# Patient Record
Sex: Female | Born: 1960 | Hispanic: No | Smoking: Never smoker
Health system: Southern US, Community
[De-identification: ages and names within clinical notes are randomized; demographics above are authoritative.]

## PROBLEM LIST (undated history)

## (undated) DIAGNOSIS — R079 Chest pain, unspecified: Secondary | ICD-10-CM

## (undated) DIAGNOSIS — M1990 Unspecified osteoarthritis, unspecified site: Secondary | ICD-10-CM

## (undated) DIAGNOSIS — Z9289 Personal history of other medical treatment: Secondary | ICD-10-CM

## (undated) DIAGNOSIS — I10 Essential (primary) hypertension: Secondary | ICD-10-CM

## (undated) HISTORY — DX: Personal history of other medical treatment: Z92.89

---

## 2010-08-04 HISTORY — PX: ANKLE SURGERY: SHX546

## 2016-01-14 ENCOUNTER — Emergency Department (HOSPITAL_COMMUNITY): Payer: Self-pay

## 2016-01-14 ENCOUNTER — Encounter (HOSPITAL_COMMUNITY): Payer: Self-pay | Admitting: *Deleted

## 2016-01-14 ENCOUNTER — Observation Stay (HOSPITAL_COMMUNITY)
Admission: EM | Admit: 2016-01-14 | Discharge: 2016-01-15 | Disposition: A | Discharge status: Home / Self Care | Payer: Self-pay | Attending: Internal Medicine | Admitting: Internal Medicine

## 2016-01-14 DIAGNOSIS — I10 Essential (primary) hypertension: Secondary | ICD-10-CM | POA: Diagnosis present

## 2016-01-14 DIAGNOSIS — I159 Secondary hypertension, unspecified: Secondary | ICD-10-CM

## 2016-01-14 DIAGNOSIS — M1712 Unilateral primary osteoarthritis, left knee: Secondary | ICD-10-CM | POA: Insufficient documentation

## 2016-01-14 DIAGNOSIS — R0789 Other chest pain: Principal | ICD-10-CM | POA: Insufficient documentation

## 2016-01-14 DIAGNOSIS — R079 Chest pain, unspecified: Secondary | ICD-10-CM | POA: Diagnosis present

## 2016-01-14 DIAGNOSIS — E669 Obesity, unspecified: Secondary | ICD-10-CM | POA: Insufficient documentation

## 2016-01-14 DIAGNOSIS — R011 Cardiac murmur, unspecified: Secondary | ICD-10-CM

## 2016-01-14 DIAGNOSIS — E785 Hyperlipidemia, unspecified: Secondary | ICD-10-CM | POA: Insufficient documentation

## 2016-01-14 DIAGNOSIS — Z8249 Family history of ischemic heart disease and other diseases of the circulatory system: Secondary | ICD-10-CM | POA: Insufficient documentation

## 2016-01-14 DIAGNOSIS — Z6841 Body Mass Index (BMI) 40.0 and over, adult: Secondary | ICD-10-CM | POA: Insufficient documentation

## 2016-01-14 HISTORY — DX: Unspecified osteoarthritis, unspecified site: M19.90

## 2016-01-14 HISTORY — DX: Essential (primary) hypertension: I10

## 2016-01-14 HISTORY — DX: Chest pain, unspecified: R07.9

## 2016-01-14 LAB — CBC
HCT: 36.4 % (ref 36.0–46.0)
Hemoglobin: 11.5 g/dL — ABNORMAL LOW (ref 12.0–15.0)
MCH: 26.6 pg (ref 26.0–34.0)
MCHC: 31.6 g/dL (ref 30.0–36.0)
MCV: 84.3 fL (ref 78.0–100.0)
PLATELETS: 249 10*3/uL (ref 150–400)
RBC: 4.32 MIL/uL (ref 3.87–5.11)
RDW: 13.9 % (ref 11.5–15.5)
WBC: 6.4 10*3/uL (ref 4.0–10.5)

## 2016-01-14 LAB — BASIC METABOLIC PANEL
Anion gap: 10 (ref 5–15)
BUN: 18 mg/dL (ref 6–20)
CO2: 27 mmol/L (ref 22–32)
CREATININE: 0.87 mg/dL (ref 0.44–1.00)
Calcium: 9.4 mg/dL (ref 8.9–10.3)
Chloride: 101 mmol/L (ref 101–111)
GFR calc Af Amer: >60 mL/min (ref >60)
Glucose, Bld: 113 mg/dL — ABNORMAL HIGH (ref 65–99)
Potassium: 3.6 mmol/L (ref 3.5–5.1)
SODIUM: 138 mmol/L (ref 135–145)

## 2016-01-14 LAB — D-DIMER, QUANTITATIVE (NOT AT ARMC): D DIMER QUANT: 0.29 ug{FEU}/mL (ref 0.00–0.50)

## 2016-01-14 LAB — I-STAT TROPONIN, ED
Troponin i, poc: 0 ng/mL (ref 0.00–0.08)
Troponin i, poc: 0 ng/mL (ref 0.00–0.08)

## 2016-01-14 MED ORDER — ASPIRIN 81 MG PO CHEW
324.0000 mg | CHEWABLE_TABLET | Freq: Once | ORAL | Status: AC
  Administered 2016-01-14: 324 mg via ORAL
  Filled 2016-01-14: qty 4

## 2016-01-14 NOTE — ED Provider Notes (Signed)
CSN: [id number]     Arrival date & time 01/14/16  1350 History   First MD Initiated Contact with Patient 01/14/16 2117     Chief Complaint  Patient presents with  . Chest Pain   Patient is a 55 y.o. female presenting with chest pain.  Chest Pain Pain location:  L chest Pain quality: pressure   Pain radiates to:  Does not radiate Pain radiates to the back: no   Pain severity:  Mild Onset quality:  Gradual Duration:  2 weeks (but worse today, started at 4am this time) Timing:  Intermittent Progression:  Partially resolved Chronicity:  Recurrent Context: breathing, movement and at rest   Relieved by:  Nothing Worsened by:  Nothing tried Ineffective treatments:  None tried Associated symptoms: headache and palpitations   Associated symptoms: no abdominal pain, no cough (chronic), no diaphoresis, no nausea, no shortness of breath and not vomiting   Risk factors: hypertension and obesity   Risk factors: no birth control, no coronary artery disease, no diabetes mellitus, no high cholesterol, no immobilization, no prior DVT/PE and no surgery     Past Medical History  Diagnosis Date  . Hypertension   . Arthritis    History reviewed. No pertinent past surgical history. No family history on file. Social History  Substance Use Topics  . Smoking status: Never Smoker   . Smokeless tobacco: None  . Alcohol Use: No   OB History    No data available     Review of Systems  Constitutional: Negative for diaphoresis.  Respiratory: Negative for cough (chronic) and shortness of breath.   Cardiovascular: Positive for chest pain and palpitations.  Gastrointestinal: Negative for nausea, vomiting and abdominal pain.  Neurological: Positive for headaches.  All other systems reviewed and are negative.     Allergies  Other  Home Medications   Prior to Admission medications   Not on File   BP 171/94 mmHg  Pulse 93  Temp(Src) 99.1 F (37.3 C) (Oral)  Resp 16  Ht  (1.575 m)   Wt 128.368 kg  BMI 51.75 kg/m2  SpO2 98%  LMP 01/09/2016 Physical Exam  Constitutional: She is oriented to person, place, and time. She appears well-developed and well-nourished. No distress.  HENT:  Head: Normocephalic and atraumatic.  Eyes: Conjunctivae are normal. Right eye exhibits no discharge. Left eye exhibits no discharge.  Neck: Normal range of motion. Neck supple.  Cardiovascular: Normal rate, regular rhythm and intact distal pulses.   Murmur (systolic) heard. Pulmonary/Chest: Effort normal and breath sounds normal. No respiratory distress.  Abdominal: Soft. Bowel sounds are normal. She exhibits no distension and no mass. There is no tenderness. There is no rebound and no guarding.  Musculoskeletal: She exhibits no edema (of LEs, no asymetey) or tenderness (no LE tenderness).  Neurological: She is alert and oriented to person, place, and time.  Skin: Skin is warm. No rash noted.  Psychiatric: She has a normal mood and affect.  Nursing note and vitals reviewed.   ED Course  Procedures (including critical care time) Labs Review Labs Reviewed  BASIC METABOLIC PANEL - Abnormal; Notable for the following:    Glucose, Bld 113 (*)    All other components within normal limits  CBC - Abnormal; Notable for the following:    Hemoglobin 11.5 (*)    All other components within normal limits  D-DIMER, QUANTITATIVE (NOT AT [facility])  I-STAT TROPOININ, ED  Rosezena Sensor, ED    Imaging Review Dg Chest 2  View  01/14/2016  CLINICAL DATA:  Left-sided chest pain since 4 a.m. EXAM: CHEST  2 VIEW COMPARISON:  None. FINDINGS: The heart size and mediastinal contours are within normal limits. Both lungs are clear. The visualized skeletal structures are unremarkable. IMPRESSION: No active cardiopulmonary disease. Electronically Signed   By: Kennith CenterEric  Mansell M.D.   On: 01/14/2016 14:41   I have personally reviewed and evaluated these images and lab results as part of my medical decision-making.    EKG Interpretation   Date/Time:  Monday January 14 2016 14:00:54 EDT Ventricular Rate:  95 PR Interval:  158 QRS Duration: 90 QT Interval:  354 QTC Calculation: 444 R Axis:   44 Text Interpretation:  Normal sinus rhythm T wave abnormality, consider  inferior ischemia Abnormal ECG No previous ECGs available Confirmed by  Bebe ShaggyWICKLINE  MD, DONALD ([id number]) on 01/14/2016 9:06:23 PM      MDM   Final diagnoses:  Chest pain, unspecified chest pain type  Heart murmur, systolic    Delta troponin negative and EKG with nonspecific T-wave changes and inferior leads. Patient has a here score of 4. Recommend stress test. Patient does not have a primary care provider established and would be unable to obtain the stress test soon.  Of note, d-dimer negative and patient overall low risk for PE, doubt. Doubt dissection, patient has no tearing back pain and pain is now resolved and denies any neurological symptoms. Doubt arrhythmia. Patient overall healthy and only has history of hypertension, unlikely to be a pleural effusion EKG not consistent with low voltage. No symptoms consistent with a pneumonia and chest x-ray negative for such. Patient does have a systolic murmur concerning for aortic stenosis but no prior echo, likely needs nonemergent evaluation for such. Doubt the chest pain is secondary to this pain is not worse with exertion. Chest pain is worse with movement and with position but no evidence of pericarditis. Patient went may benefit from short-term NSAIDs as well cardiac workup remains negative after stress test.   Discuss recommendation for stress test and patient would like to be admitted to obtain. Admit.    Sidney AceAlison Charruf Adonia Porada, MD 01/14/16 2228  Zadie Rhineonald Wickline, MD 01/15/16 [phone]

## 2016-01-14 NOTE — ED Notes (Signed)
MD at bedside. 

## 2016-01-14 NOTE — H&P (Addendum)
Carrie Rush ZOX:[id number] DOB: 1961/07/17 DOA: 01/14/2016     PCP: No primary care provider on file.   Outpatient Specialists: none Patient coming from:   home Lives   With family    Chief Complaint: Chest pressure HPI: Carrie LeiFaizeh Dubberly is a 55 y.o. female with medical history significant of HTN and arthritis    Presented with intermittent chest pain has been going on for the past 2 weeks but lately started to wake her up. At first was only lasting a few seconds but today was more severe and persistent. she woke up with another episode this morning. Describes as twisting, pinching, pressure-like sensation. Reproducible by palpation.  Not radiating not in association shortness of breath and nausea vomiting. Today she felt that if she took a deep breath it made her cough she was concerned and came to ER.  Respective includes hypertension she does not know if she has diabetes does not have primary care provider. Chest pain since he worse with movement and position  IN ER: Afebrile heart rate up to 96 blood pressure 174/115.  WBC 6.4 hemoglobin 11.5  Chest x-ray unremarkable EKG nonischemic troponin unremarkable 2    Hospitalist was called for admission for chest pain evaluation  Review of Systems:    Pertinent positives include: chest pain,   Constitutional:  No weight loss, night sweats, Fevers, chills, fatigue, weight loss  HEENT:  No headaches, Difficulty swallowing,Tooth/dental problems,Sore throat,  No sneezing, itching, ear ache, nasal congestion, post nasal drip,  Cardio-vascular:  No Orthopnea, PND, anasarca, dizziness, palpitations.no Bilateral lower extremity swelling  GI:  No heartburn, indigestion, abdominal pain, nausea, vomiting, diarrhea, change in bowel habits, loss of appetite, melena, blood in stool, hematemesis Resp:  no shortness of breath at rest. No dyspnea on exertion, No excess mucus, no productive cough, No non-productive cough, No coughing up of blood.No  change in color of mucus.No wheezing. Skin:  no rash or lesions. No jaundice GU:  no dysuria, change in color of urine, no urgency or frequency. No straining to urinate.  No flank pain.  Musculoskeletal:  No joint pain or no joint swelling. No decreased range of motion. No back pain.  Psych:  No change in mood or affect. No depression or anxiety. No memory loss.  Neuro: no localizing neurological complaints, no tingling, no weakness, no double vision, no gait abnormality, no slurred speech, no confusion  As per HPI otherwise 10 point review of systems negative.   Past Medical History: Past Medical History  Diagnosis Date  . Hypertension   . Arthritis    History reviewed. No pertinent past surgical history.   Social History:  Ambulatory  Independently     reports that she has never smoked. She does not have any smokeless tobacco history on file. She reports that she does not drink alcohol or use illicit drugs.  Allergies:   Allergies  Allergen Reactions  . Other     Air freshener causes coughing        Family History:    Family History  Problem Relation Age of Onset  . CAD Brother   . Lymphoma Other   . Diabetes Neg Hx   . Stroke Neg Hx   . Alzheimer's disease Mother     Medications: Prior to Admission medications   Medication Sig Start Date End Date Taking? Authorizing Provider  folic acid (FOLVITE) 1 MG tablet Take 10 mg by mouth daily.   Yes Historical Provider, MD  Omega-3 Fatty Acids (  FISH OIL PO) Take 1 tablet by mouth daily.   Yes Historical Provider, MD  PRESCRIPTION MEDICATION Take 1-2 tablets by mouth daily. salazopyrin   Yes Historical Provider, MD  triamterene-hydrochlorothiazide (DYAZIDE) 37.5-25 MG capsule Take 1 capsule by mouth daily.   Yes Historical Provider, MD    Physical Exam: Patient Vitals for the past 24 hrs:  BP Temp Temp src Pulse Resp SpO2 Height Weight  01/14/16 2245 118/70 mmHg - - 85 20 96 % - -  01/14/16 2230 123/67 mmHg -  - 93 17 98 % - -  01/14/16 2215 123/72 mmHg - - 91 22 99 % - -  01/14/16 2200 121/64 mmHg - - 88 18 98 % - -  01/14/16 2130 151/73 mmHg - - 89 18 99 % - -  01/14/16 2113 - - - - - 98 % - -  01/14/16 2100 171/94 mmHg - - 93 16 98 % - -  01/14/16 1800 158/88 mmHg - - 86 16 98 % - -  01/14/16 1402 (!) 174/115 mmHg 99.1 F (37.3 C) Oral 96 18 99 % 5\' 2"  (1.575 m) 128.368 kg (283 lb)    1. General:  in No Acute distress 2. Psychological: Alert and   Oriented 3. Head/ENT:   Moist   Mucous Membranes                          Head Non traumatic, neck supple                          Normal   Dentition 4. SKIN:   decreased Skin turgor,  Skin clean Dry and intact no rash 5. Heart: Regular rate and rhythm no  Murmur, Rub or gallop 6. Lungs:  Clear to auscultation bilaterally, no wheezes or crackles   7. Abdomen: Soft, non-tender, Non distended 8. Lower extremities: no clubbing, cyanosis, or edema 9. Neurologically Grossly intact, moving all 4 extremities equally 10. MSK: Normal range of motion   body mass index is 51.75 kg/(m^2).  Labs on Admission:   Labs on Admission: I have personally reviewed following labs and imaging studies  CBC:  Recent Labs Lab 01/14/16 1416  WBC 6.4  HGB 11.5*  HCT 36.4  MCV 84.3  PLT 249   Basic Metabolic Panel:  Recent Labs Lab 01/14/16 1416  NA 138  K 3.6  CL 101  CO2 27  GLUCOSE 113*  BUN 18  CREATININE 0.87  CALCIUM 9.4   GFR: Estimated Creatinine Clearance: 97.2 mL/min (by C-G formula based on Cr of 0.87). Liver Function Tests: No results for input(s): AST, ALT, ALKPHOS, BILITOT, PROT, ALBUMIN in the last 168 hours. No results for input(s): LIPASE, AMYLASE in the last 168 hours. No results for input(s): AMMONIA in the last 168 hours. Coagulation Profile: No results for input(s): INR, PROTIME in the last 168 hours. Cardiac Enzymes: No results for input(s): CKTOTAL, CKMB, CKMBINDEX, TROPONINI in the last 168 hours. BNP (last 3  results) No results for input(s): PROBNP in the last 8760 hours. HbA1C: No results for input(s): HGBA1C in the last 72 hours. CBG: No results for input(s): GLUCAP in the last 168 hours. Lipid Profile: No results for input(s): CHOL, HDL, LDLCALC, TRIG, CHOLHDL, LDLDIRECT in the last 72 hours. Thyroid Function Tests: No results for input(s): TSH, T4TOTAL, FREET4, T3FREE, THYROIDAB in the last 72 hours. Anemia Panel: No results for input(s): VITAMINB12, FOLATE, FERRITIN, TIBC,  IRON, RETICCTPCT in the last 72 hours. Urine analysis: No results found for: COLORURINE, APPEARANCEUR, LABSPEC, PHURINE, GLUCOSEU, HGBUR, BILIRUBINUR, KETONESUR, PROTEINUR, UROBILINOGEN, NITRITE, LEUKOCYTESUR Sepsis Labs: (procalcitonin:4,lacticidven:4) )No results found for this or any previous visit (from the past 240 hour(s)).     UA  Not ordered  No results found for: HGBA1C  Estimated Creatinine Clearance: 97.2 mL/min (by C-G formula based on Cr of 0.87).  BNP (last 3 results) No results for input(s): PROBNP in the last 8760 hours.   ECG REPORT  Independently reviewed Rate:95   Rhythm: Normal sinus rhythm ST&T Change: t wave flattening in lateral leads QTC 444  Filed Weights   01/14/16 1402  Weight: 128.368 kg (283 lb)     Cultures: No results found for: SDES, SPECREQUEST, CULT, REPTSTATUS   Radiological Exams on Admission: Dg Chest 2 View  01/14/2016  CLINICAL DATA:  Left-sided chest pain since 4 a.m. EXAM: CHEST  2 VIEW COMPARISON:  None. FINDINGS: The heart size and mediastinal contours are within normal limits. Both lungs are clear. The visualized skeletal structures are unremarkable. IMPRESSION: No active cardiopulmonary disease. Electronically Signed   By: Kennith Center M.D.   On: 01/14/2016 14:41    Chart has been reviewed    Assessment/Plan  55 y.o. female with medical history significant of HTN and arthritis here with chest pain with increasing duration and severity  but atypical features  Present on Admission:  . Hypertension continue home medications . Chest pain - atypical chest pain but given increasing in severity nature and  risk factors will admit, monitor on telemetry, cycle cardiac enzymes, obtain serial ECG. Further risk stratify with lipid panel, hgA1C, obtain TSH. Make sure patient is on Aspirin. Further treatment based on the currently pending results. Cardiology consult for stress testing  Other plan as per orders.  DVT prophylaxis:   Lovenox     Code Status:  FULL CODE  as per patient   Family Communication:   Family   at  Bedside  plan of care was discussed with  Stanford Breed Kleiman [phone]  Disposition Plan:    To home once workup is complete and patient is stable   Consults called: emailed cardiology  Admission status:    obs    Level of care   tele          Deanza Upperman 01/14/2016, 11:35 PM    [facility]  Pager [phone]   after 2 AM please page floor coverage PA If 7AM-7PM, please contact the day team taking care of the patient  [email or web address]  Password [email or web address]

## 2016-01-14 NOTE — ED Notes (Signed)
Pt states L sided chest pain that woke her up at 4 am.  Pain is intermittant and she describes as a "twist".  Is not accompanied by sob or nausea.

## 2016-01-15 ENCOUNTER — Observation Stay (HOSPITAL_BASED_OUTPATIENT_CLINIC_OR_DEPARTMENT_OTHER): Payer: Self-pay

## 2016-01-15 ENCOUNTER — Other Ambulatory Visit: Payer: Self-pay | Admitting: Cardiology

## 2016-01-15 ENCOUNTER — Encounter (HOSPITAL_COMMUNITY): Payer: Self-pay | Admitting: General Practice

## 2016-01-15 DIAGNOSIS — R079 Chest pain, unspecified: Secondary | ICD-10-CM

## 2016-01-15 DIAGNOSIS — I10 Essential (primary) hypertension: Secondary | ICD-10-CM

## 2016-01-15 DIAGNOSIS — R0789 Other chest pain: Secondary | ICD-10-CM

## 2016-01-15 DIAGNOSIS — I119 Hypertensive heart disease without heart failure: Secondary | ICD-10-CM

## 2016-01-15 DIAGNOSIS — R072 Precordial pain: Secondary | ICD-10-CM

## 2016-01-15 DIAGNOSIS — E785 Hyperlipidemia, unspecified: Secondary | ICD-10-CM

## 2016-01-15 LAB — ECHOCARDIOGRAM COMPLETE
E decel time: 190 msec
EERAT: 8.25
FS: 32 % (ref 28–44)
HEIGHTINCHES: 62 in
IV/PV OW: 1.06
LA diam end sys: 33 mm
LA diam index: 1.34 cm/m2
LA vol A4C: 75.6 ml
LASIZE: 33 mm
LDCA: 2.54 cm2
LV E/e' medial: 8.25
LV E/e'average: 8.25
LV TDI E'MEDIAL: 6.31
LVELAT: 8.7 cm/s
LVOT diameter: 18 mm
MV Dec: 190
MV pk A vel: 110 m/s
MVPG: 2 mmHg
MVPKEVEL: 71.8 m/s
PW: 12.7 mm — AB (ref 0.6–1.1)
TDI e' lateral: 8.7
WEIGHTICAEL: 4536 [oz_av]

## 2016-01-15 LAB — LIPID PANEL
CHOL/HDL RATIO: 4.4 ratio
Cholesterol: 149 mg/dL (ref 0–200)
HDL: 34 mg/dL — ABNORMAL LOW (ref >40)
LDL CALC: 80 mg/dL (ref 0–99)
TRIGLYCERIDES: 173 mg/dL — AB (ref <150)
VLDL: 35 mg/dL (ref 0–40)

## 2016-01-15 LAB — TSH: TSH: 1.681 u[IU]/mL (ref 0.350–4.500)

## 2016-01-15 LAB — TROPONIN I: Troponin I: <0.03 ng/mL (ref <0.031)

## 2016-01-15 MED ORDER — TRIAMTERENE-HCTZ 37.5-25 MG PO TABS
1.0000 | ORAL_TABLET | Freq: Every day | ORAL | Status: DC
  Administered 2016-01-15: 1 via ORAL
  Filled 2016-01-15: qty 1

## 2016-01-15 MED ORDER — PERFLUTREN LIPID MICROSPHERE
1.0000 mL | INTRAVENOUS | Status: AC | PRN
  Filled 2016-01-15: qty 10

## 2016-01-15 MED ORDER — MORPHINE SULFATE (PF) 2 MG/ML IV SOLN: 2.0000 mg | INTRAVENOUS | Status: DC | PRN

## 2016-01-15 MED ORDER — PERFLUTREN LIPID MICROSPHERE
INTRAVENOUS | Status: AC
  Administered 2016-01-15: 1 mL
  Filled 2016-01-15: qty 10

## 2016-01-15 MED ORDER — ACETAMINOPHEN 325 MG PO TABS: 650.0000 mg | ORAL_TABLET | ORAL | Status: DC | PRN

## 2016-01-15 MED ORDER — ENOXAPARIN SODIUM 40 MG/0.4ML ~~LOC~~ SOLN
40.0000 mg | Freq: Every day | SUBCUTANEOUS | Status: DC
  Administered 2016-01-15: 40 mg via SUBCUTANEOUS
  Filled 2016-01-15: qty 0.4

## 2016-01-15 MED ORDER — ALPRAZOLAM 0.25 MG PO TABS: 0.2500 mg | ORAL_TABLET | Freq: Two times a day (BID) | ORAL | Status: DC | PRN

## 2016-01-15 MED ORDER — ONDANSETRON HCL 4 MG/2ML IJ SOLN: 4.0000 mg | Freq: Four times a day (QID) | INTRAMUSCULAR | Status: DC | PRN

## 2016-01-15 NOTE — Consult Note (Addendum)
Cardiology Consult    Patient ID: Carrie Rush MRN: [id number], DOB/AGE: [birth year]   Admit date: 01/14/2016 Date of Consult: 01/15/2016  Primary Physician: No PCP Per Patient Primary Cardiologist: New Requesting Provider: Dr. Adela Glimpseoutova/ IM  Patient Profile    [age] yo female with PMH of HTN/Arthritis and obesity who [facility] with reports of chest pain.   Past Medical History   Past Medical History  Diagnosis Date  . Hypertension   . Arthritis   . Chest pain at rest 01/14/2016    Past Surgical History  Procedure Laterality Date  . Ankle surgery Left 2012     Allergies  Allergies  Allergen Reactions  . Other     Air freshener causes coughing     History of Present Illness    Carrie Rush is a 55 yo female patient with PMH of HTN/Arthritis and obesity. States she has recently found a doctor in the area and is currently being treated for HTN, which is a new dx for her. She reports normally being a very active individual, until her developed arthritis in bilateral knees, which has limited her on her physical activity and leading to increased weight gain. She has never seen a cardiologist, and denies any family hx of heart diease.   [facility] with reports of left sided chest pressure/squeezing that occurred yesterday morning around 0400am waking her from sleep. The pain was constant, causing her to come the ED for further evaluation. She did not take any medication prior to her arrival to the ED. Does reports having had similar episodes of this pain within the past 10-14 days. Is not typically present with exertion, but comes during a resting state. In the ED she was given 324 ASA which relieved the pain/pressure. In the ED her labs showed Cr 0.87, D dimer 0.29, POC trop negative x2, and negative Trop. Chest x-ray was non-acute. EKG showed SR with non-specific T wave changes. She was admitted to Internal medicine for observation and further work  up.   Cardiology has been consulted in relation to her reports of chest pain.   Inpatient Medications    . enoxaparin (LOVENOX) injection  40 mg Subcutaneous Daily  . triamterene-hydrochlorothiazide  1 tablet Oral Daily    Family History    Family History  Problem Relation Age of Onset  . CAD Brother   . Lymphoma Other   . Diabetes Neg Hx   . Stroke Neg Hx   . Alzheimer's disease Mother     Social History    Social History   Social History  . Marital Status: Widowed    Spouse Name: N/A  . Number of Children: N/A  . Years of Education: N/A   Occupational History  . Not on file.   Social History Main Topics  . Smoking status: Never Smoker   . Smokeless tobacco: Never Used  . Alcohol Use: No  . Drug Use: No  . Sexual Activity: Not on file   Other Topics Concern  . Not on file   Social History Narrative     Review of Systems    General:  No chills, fever, night sweats or weight changes.  Cardiovascular:  + chest pain, dyspnea on exertion, edema, orthopnea, palpitations, paroxysmal nocturnal dyspnea. Dermatological: No rash, lesions/masses Respiratory: No cough, dyspnea Urologic: No hematuria, dysuria Abdominal:   No nausea, vomiting, diarrhea, bright red blood per rectum, melena, or hematemesis Neurologic:  No visual changes,  wkns, changes in mental status. All other systems reviewed and are otherwise negative except as noted above.  Physical Exam    Blood pressure 111/72, pulse 83, temperature 98.4 F (36.9 C), temperature source Oral, resp. rate 18, height  (1.575 m), weight 283 lb 8 oz (128.595 kg), last menstrual period 01/09/2016, SpO2 97 %.  General: Pleasant obese female, NAD Psych: Normal affect. Neuro: Alert and oriented X 3. Moves all extremities spontaneously. HEENT: Normal  Neck: Supple without bruits or JVD. Lungs:  Resp regular and unlabored, CTA. Heart: RRR no s3, s4, or murmurs. Abdomen: Soft, non-tender, non-distended, BS + x 4.    Extremities: No clubbing, cyanosis or edema. DP/PT/Radials 2+ and equal bilaterally.  Labs    Troponin [facility] Test)  Recent Labs  01/14/16 2155  TROPIPOC 0.00    Recent Labs  01/15/16 0058 01/15/16 0611  TROPONINI <0.03 <0.03   Lab Results  Component Value Date   WBC 6.4 01/14/2016   HGB 11.5* 01/14/2016   HCT 36.4 01/14/2016   MCV 84.3 01/14/2016   PLT 249 01/14/2016    Recent Labs Lab 01/14/16 1416  NA 138  K 3.6  CL 101  CO2 27  BUN 18  CREATININE 0.87  CALCIUM 9.4  GLUCOSE 113*   Lab Results  Component Value Date   CHOL 149 01/15/2016   HDL 34* 01/15/2016   LDLCALC 80 01/15/2016   TRIG 173* 01/15/2016   Lab Results  Component Value Date   DDIMER 0.29 01/14/2016     Radiology Studies    Dg Chest 2 View  01/14/2016  CLINICAL DATA:  Left-sided chest pain since 4 a.m. EXAM: CHEST  2 VIEW COMPARISON:  None. FINDINGS: The heart size and mediastinal contours are within normal limits. Both lungs are clear. The visualized skeletal structures are unremarkable. IMPRESSION: No active cardiopulmonary disease. Electronically Signed   By: Kennith Center M.D.   On: 01/14/2016 14:41    ECG & Cardiac Imaging    EKG: SR with non-specific T wave changes  Echo: pending  Assessment & Plan    Carrie Rush is a 55 yo female patient with PMH of HTN/Arthritis and obesity. She presented to the [facility] ED with reports of left sided chest pressure/squeezing that occurred yesterday morning around 0400am waking her from sleep. The pain was constant, causing her to come the ED for further evaluation. She did not take any medication prior to her arrival to the ED. Does reports having had similar episodes of this pain within the past 10-14 days. Is not typically present with exertion, but comes during a resting state. In the ED she was given 324 ASA which relieved the pain/pressure. In the ED her labs showed Cr 0.87, D dimer 0.29, POC trop negative x2, and negative Trop.  Chest x-ray was non-acute. EKG showed SR with non-specific T wave changes.  1. Chest pain: She denies having had any further chest pain during admission. Has been ambulatory around the room without pain or pressure. Trop have been neg x3. Ekg did show some non-specific T wave abnormalities. She does have a relatively new dx of HTN putting her at higher risk. Denies any hx of smoking.  --Lipid panel did show HDL of 34, could benefit from statin therapy --Hgb A1c pending --She appears to be relatively low risk, but would benefit from stress testing. Unable to complete this today, so will allow for diet.   2. HTN: one high reading, but mostly controlled. Continue  with current medications.  -Dr. Delton See to see  Signed, Laverda Page, NP-C Pager [phone] 01/15/2016, 12:01 PM  The patient was seen, examined and discussed with Laverda Page, NP-C and I agree with the above.   A very pleasant 55 year old female originally from [place], with prior medical history of obesity, and snoring who has been experiencing chest pain episodes but always wake her up from sleep at night. 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 The patient had negative troponin 3 her EKG shows normal sinus rhythm with nonspecific changes in the inferior leads. Her echocardiogram shows normal LV EF, 60-65% with grade 1 diastolic dysfunction what is abnormal for her age. She has mild concentric LVH. She describes her pain as left-sided, sharp sometimes pressure-like with no radiation to her back arm or neck, no associated shortness of breath and can last anywhere from 5 minutes to half an hour. She denies any chest pain on exertion. She has mildly elevated glucose hemoglobin A1c is pending. Her triglyceride are elevated at 173. LDL is 80. We will schedule  an outpatient Lexiscan nuclear stress test to evaluate for ischemia and also schedule an outpatient sleep study to evaluate for sleep apnea. She has never smoked, her parents died of old age in their upper 55s with no known cardiac history. This will be followed up in outpatient visit in our clinic. She is currently stable to be discharged.  Tobias Alexander 01/15/2016

## 2016-01-15 NOTE — Discharge Summary (Signed)
Physician Discharge Summary  Carrie Rush BJY:[id number] DOB: 10/18/1960 DOA: 01/14/2016  PCP: No PCP Per Patient  Admit date: 01/14/2016 Discharge date: 01/15/2016  Recommendations for Outpatient Follow-up:  1. No changes in medications on discharge.   Discharge Diagnoses:  Active Problems:   Hypertension   Chest pain    Discharge Condition: stable   Diet recommendation: as tolerated   History of present illness:   Per HPI 01/14/2016 "55 y.o. female with medical history significant of HTN and arthritis. Presented with intermittent chest pain has been going on for the past 2 weeks but lately started to wake her up. At first was only lasting a few seconds but today was more severe and persistent. she woke up with another episode this morning. Describes as twisting, pinching, pressure-like sensation. Reproducible by palpation. Not radiating not in association shortness of breath and nausea vomiting. Today she felt that if she took a deep breath it made her cough she was concerned and came to ER.  Respective includes hypertension she does not know if she has diabetes does not have primary care provider. Chest pain since he worse with movement and position"  Hospital Course:   Active Problems: Chest pain at rest - Ruled out ACS - Trop x 3 negative - 2 D ECHO 60% and grade 1 diastolic dysfunction  Hypertension, ess - Continue Maxzide  Dyslipidemia - Continue omega 3    Signed:  Manson PasseyEVINE, Yanitza Shvartsman, MD  [facility] 01/15/2016, 3:26 PM  Pager #: [phone]  Time spent in minutes: less than 30 minutes  Procedures:  2 D ECHO - EF 60%, grade 1 diastolic dysfunction   Consultations:  None   Discharge Exam: Filed Vitals:   01/15/16 0530 01/15/16 1255  BP: 111/72 128/76  Pulse: 83 86  Temp: 98.4 F (36.9 C) 98.6 F (37 C)  Resp: 18 19   Filed Vitals:   01/14/16 2315 01/15/16 0015 01/15/16 0530 01/15/16 1255  BP: 112/69 94/65 111/72 128/76  Pulse: 93 85 83  86  Temp:  98.2 F (36.8 C) 98.4 F (36.9 C) 98.6 F (37 C)  TempSrc:  Oral Oral Oral  Resp: 21 20 18 19   Height:      Weight:  128.595 kg (283 lb 8 oz)    SpO2: 96% 99% 97% 98%    General: Pt is alert, follows commands appropriately, not in acute distress Cardiovascular: Regular rate and rhythm, S1/S2 +, no murmurs Respiratory: Clear to auscultation bilaterally, no wheezing, no crackles, no rhonchi Abdominal: Soft, non tender, non distended, bowel sounds +, no guarding Extremities: no edema, no cyanosis, pulses palpable bilaterally DP and PT Neuro: Grossly nonfocal  Discharge Instructions  Discharge Instructions    Call MD for:  difficulty breathing, headache or visual disturbances    Complete by:  As directed      Call MD for:  persistant dizziness or light-headedness    Complete by:  As directed      Call MD for:  persistant nausea and vomiting    Complete by:  As directed      Call MD for:  severe uncontrolled pain    Complete by:  As directed      Diet - low sodium heart healthy    Complete by:  As directed      Increase activity slowly    Complete by:  As directed             Medication List    TAKE these medications  FISH OIL PO  Take 1 tablet by mouth daily.     folic acid 1 MG tablet  Commonly known as:  FOLVITE  Take 10 mg by mouth daily.     PRESCRIPTION MEDICATION  Take 1-2 tablets by mouth daily. salazopyrin     triamterene-hydrochlorothiazide 37.5-25 MG capsule  Commonly known as:  DYAZIDE  Take 1 capsule by mouth daily.           Follow-up Information    Follow up with [facility]. Schedule an appointment as soon as possible for a visit in 2 days.   Contact information:   [address] [place] [address] [phone]       The results of significant diagnostics from this hospitalization (including imaging, microbiology, ancillary and laboratory) are listed below for  reference.    Significant Diagnostic Studies: Dg Chest 2 View  01/14/2016  CLINICAL DATA:  Left-sided chest pain since 4 a.m. EXAM: CHEST  2 VIEW COMPARISON:  None. FINDINGS: The heart size and mediastinal contours are within normal limits. Both lungs are clear. The visualized skeletal structures are unremarkable. IMPRESSION: No active cardiopulmonary disease. Electronically Signed   By: Kennith Center M.D.   On: 01/14/2016 14:41    Microbiology: No results found for this or any previous visit (from the past 240 hour(s)).   Labs: Basic Metabolic Panel:  Recent Labs Lab 01/14/16 1416  NA 138  K 3.6  CL 101  CO2 27  GLUCOSE 113*  BUN 18  CREATININE 0.87  CALCIUM 9.4   Liver Function Tests: No results for input(s): AST, ALT, ALKPHOS, BILITOT, PROT, ALBUMIN in the last 168 hours. No results for input(s): LIPASE, AMYLASE in the last 168 hours. No results for input(s): AMMONIA in the last 168 hours. CBC:  Recent Labs Lab 01/14/16 1416  WBC 6.4  HGB 11.5*  HCT 36.4  MCV 84.3  PLT 249   Cardiac Enzymes:  Recent Labs Lab 01/15/16 0058 01/15/16 0611 01/15/16 1157  TROPONINI <0.03 <0.03 <0.03   BNP: BNP (last 3 results) No results for input(s): BNP in the last 8760 hours.  ProBNP (last 3 results) No results for input(s): PROBNP in the last 8760 hours.  CBG: No results for input(s): GLUCAP in the last 168 hours.

## 2016-01-15 NOTE — Progress Notes (Signed)
Patient received from ED and oriented to room. Tele monitoring called in, son present at bedside. Call light within reach.

## 2016-01-15 NOTE — Discharge Instructions (Signed)

## 2016-01-15 NOTE — Progress Notes (Signed)
Echocardiogram 2D Echocardiogram with Definity has been performed.  Carrie Rush, Carrie Rush 01/15/2016, 2:00 PM

## 2016-01-15 NOTE — Progress Notes (Signed)
Orders received to discharge patient.  Patient expresses readiness for discharge.  Discharge instructions, follow up, medications and instructions for their use were discussed with patient and she voiced understanding.  Telemetry monitor was removed and CCMD was notified.  Patient is in no apparent distress at time of discharge.

## 2016-01-16 LAB — HEMOGLOBIN A1C
Hgb A1c MFr Bld: 6.1 % — ABNORMAL HIGH (ref 4.8–5.6)
MEAN PLASMA GLUCOSE: 128 mg/dL

## 2016-01-25 ENCOUNTER — Telehealth (HOSPITAL_COMMUNITY): Payer: Self-pay

## 2016-01-25 NOTE — Telephone Encounter (Signed)
Encounter complete. 

## 2016-01-29 ENCOUNTER — Telehealth (HOSPITAL_COMMUNITY): Payer: Self-pay

## 2016-01-29 NOTE — Telephone Encounter (Signed)
Encounter complete. 

## 2016-01-30 ENCOUNTER — Ambulatory Visit (HOSPITAL_COMMUNITY)
Admission: RE | Admit: 2016-01-30 | Discharge: 2016-01-30 | Disposition: A | Discharge status: Home / Self Care | Payer: Self-pay | Source: Ambulatory Visit | Attending: Cardiovascular Disease | Admitting: Cardiovascular Disease

## 2016-01-30 DIAGNOSIS — Z6841 Body Mass Index (BMI) 40.0 and over, adult: Secondary | ICD-10-CM | POA: Insufficient documentation

## 2016-01-30 DIAGNOSIS — E669 Obesity, unspecified: Secondary | ICD-10-CM | POA: Insufficient documentation

## 2016-01-30 DIAGNOSIS — Z8249 Family history of ischemic heart disease and other diseases of the circulatory system: Secondary | ICD-10-CM | POA: Insufficient documentation

## 2016-01-30 DIAGNOSIS — R079 Chest pain, unspecified: Secondary | ICD-10-CM

## 2016-01-30 DIAGNOSIS — I10 Essential (primary) hypertension: Secondary | ICD-10-CM | POA: Insufficient documentation

## 2016-01-30 MED ORDER — TECHNETIUM TC 99M TETROFOSMIN IV KIT
27.0000 | PACK | Freq: Once | INTRAVENOUS | Status: AC | PRN
  Administered 2016-01-30: 27 via INTRAVENOUS
  Filled 2016-01-30: qty 27

## 2016-01-30 MED ORDER — REGADENOSON 0.4 MG/5ML IV SOLN
0.4000 mg | Freq: Once | INTRAVENOUS | Status: AC
  Administered 2016-01-30: 0.4 mg via INTRAVENOUS

## 2016-01-31 ENCOUNTER — Ambulatory Visit (HOSPITAL_COMMUNITY)
Admission: RE | Admit: 2016-01-31 | Discharge: 2016-01-31 | Disposition: A | Discharge status: Home / Self Care | Payer: Self-pay | Source: Ambulatory Visit | Attending: Cardiology | Admitting: Cardiology

## 2016-01-31 LAB — MYOCARDIAL PERFUSION IMAGING
CHL CUP NUCLEAR SSS: 3
CHL CUP RESTING HR STRESS: 86 {beats}/min
LVDIAVOL: 84 mL (ref 46–106)
LVSYSVOL: 26 mL
Peak HR: 106 {beats}/min
SDS: 3
SRS: 0
TID: 0.98

## 2016-01-31 MED ORDER — TECHNETIUM TC 99M TETROFOSMIN IV KIT
31.3000 | PACK | Freq: Once | INTRAVENOUS | Status: AC | PRN
  Administered 2016-01-31: 31.3 via INTRAVENOUS

## 2016-02-03 NOTE — Progress Notes (Signed)
Cardiology Office Note:    Date:  02/04/2016   ID:  Carrie Rush, DOB [birth year], MRN [id number]  PCP:  No PCP Per Patient  Cardiologist:  Dr. Tobias AlexanderKatarina Nelson   Electrophysiologist:  n/a  Referring MD: No ref. provider found   Chief Complaint  Patient presents with  . Hospitalization Follow-up    admx with chest pain    History of Present Illness:     Carrie Rush is a 55 y.o. [place] female with a hx of HTN, DJD, obesity.   Admitted 6/12-6/13 with chest pain.  CEs remained neg. CXR was unremarkable.  ECG was unremarkable.  She was seen by Dr. Tobias AlexanderKatarina Nelson for Cardiology evaluation.  Echo showed normal LVEF and mild diastolic dysfunction.  Outpatient stress testing was arranged. This was performed 01/31/16 and was low risk demonstrating normal perfusion with EF 69%.  She returns for follow-up.  She is here alone. Since DC, she has done well.   She denies further chest discomfort, significant dyspnea, syncope, orthopnea, PND, edema.  I reviewed her stress test results with her today.    Past Medical History  Diagnosis Date  . Hypertension   . Arthritis   . Chest pain at rest     Admx in 6/17 >> neg CEs, normal Echo, normal OP stress nuc study  . History of nuclear stress test     a. Myoview 01/31/16:  EF 69%, normal perfusion, low risk  . History of echocardiogram     a. Echo 01/15/16: mild conc LVH, EF 60-65%, no RWMA, Gr 1 DD.     Past Surgical History  Procedure Laterality Date  . Ankle surgery Left 2012    Current Medications: Outpatient Prescriptions Prior to Visit  Medication Sig Dispense Refill  . folic acid (FOLVITE) 1 MG tablet Take 10 mg by mouth daily.    . Omega-3 Fatty Acids (FISH OIL PO) Take 1 tablet by mouth daily.    Marland Kitchen. PRESCRIPTION MEDICATION Take 1-2 tablets by mouth daily. salazopyrin    . triamterene-hydrochlorothiazide (DYAZIDE) 37.5-25 MG capsule Take 1 capsule by mouth daily.     No facility-administered medications prior to visit.       Allergies:   Other   Social History   Social History  . Marital Status: Widowed    Spouse Name: N/A  . Number of Children: N/A  . Years of Education: N/A   Social History Main Topics  . Smoking status: Never Smoker   . Smokeless tobacco: Never Used  . Alcohol Use: No  . Drug Use: No  . Sexual Activity: Not Asked   Other Topics Concern  . None   Social History Narrative     Family History:  The patient's family history includes Alzheimer's disease in her mother; CAD in her brother; Lymphoma in her other. There is no history of Diabetes or Stroke.   ROS:   Please see the history of present illness.    ROS All other systems reviewed and are negative.   Physical Exam:    VS:  BP 142/90 mmHg  Pulse 76  Ht 5\' 2"  (1.575 m)  Wt 290 lb 1.9 oz (131.598 kg)  BMI 53.05 kg/m2  LMP 01/09/2016   Physical Exam  Constitutional: She is oriented to person, place, and time. She appears well-developed and well-nourished.  HENT:  Head: Normocephalic and atraumatic.  Neck: Normal range of motion. No JVD present.  Cardiovascular: Regular rhythm, S1 normal and S2 normal.   No murmur heard. Pulmonary/Chest:  Breath sounds normal. She has no wheezes. She has no rhonchi. She has no rales.  Abdominal: Soft. There is no tenderness.  Musculoskeletal: She exhibits no edema.  Neurological: She is alert and oriented to person, place, and time.  Skin: Skin is warm and dry.  Psychiatric: She has a normal mood and affect.    Wt Readings from Last 3 Encounters:  02/04/16 290 lb 1.9 oz (131.598 kg)  01/30/16 283 lb (128.368 kg)  01/15/16 283 lb 8 oz (128.595 kg)      Studies/Labs Reviewed:     EKG:  EKG is  ordered today.  The ekg ordered today demonstrates NSR, HR 76, normal axis, QTc 447 ms  Recent Labs: 01/14/2016: BUN 18; Creatinine, Ser 0.87; Hemoglobin 11.5*; Platelets 249; Potassium 3.6; Sodium 138 01/15/2016: TSH 1.681   Recent Lipid Panel    Component Value Date/Time   CHOL  149 01/15/2016 0619   TRIG 173* 01/15/2016 0619   HDL 34* 01/15/2016 0619   CHOLHDL 4.4 01/15/2016 0619   VLDL 35 01/15/2016 0619   LDLCALC 80 01/15/2016 0619    Additional studies/ records that were reviewed today include:   Myoview 01/31/16  The left ventricular ejection fraction is hyperdynamic (>65%).  Nuclear stress EF: 69%.  No T wave inversion was noted during stress.  There was no ST segment deviation noted during stress.  This is a low risk study. Normal perfusion. LVEF 69% with normal wall motion. This is a low risk study.  Echo 01/15/16 - Left ventricle: The cavity size was normal. There was mild   concentric hypertrophy. Systolic function was normal. The   estimated ejection fraction was in the range of 60% to 65%. Wall   motion was normal; there were no regional wall motion   abnormalities. Doppler parameters are consistent with abnormal   left ventricular relaxation (grade 1 diastolic dysfunction).   ASSESSMENT:     1. Chest pain, unspecified chest pain type   2. Essential hypertension   3. Osteoarthritis, unspecified osteoarthritis type, unspecified site     PLAN:     In order of problems listed above:  1. Chest pain - She was admitted with chest pain last month. CEs were neg. Echo demonstrated normal LVEF and normal wall motion. OP stress nuc study was low risk with normal perfusion. She denies further chest discomfort. We reviewed her test results. She can FU with Dr. Tobias AlexanderKatarina Nelson as needed.  2. HTN - BP borderline. No medications yet today. She needs to establish with primary care.  I have given her information for the [facility] as well as the [facility].    3. Arthritis - She tells me that she was on a medication that required lab work every 2 mos while she lived in [place].  It sounds like she may have been on Methotrexate or an equivalent.  She needs to get in with primary care to address this problem.   Medication  Adjustments/Labs and Tests Ordered: Current medicines are reviewed at length with the patient today.  Concerns regarding medicines are outlined above.  Medication changes, Labs and Tests ordered today are outlined in the Patient Instructions noted below. Patient Instructions  Medication Instructions:  No changes.  See your medication list.  Labwork: None   Testing/Procedures: None   Follow-Up: Dr. Tobias AlexanderKatarina Nelson as needed.   *Please try to establish with [facility] and [facility] as your primary care provider. [phone]) [address],  [place], [place] [address]   *Another option for primary care would be the [facility] at [address] [place], [place] [address].  Phone number is [phone].  You can try to contact them if the [facility] cannot help you.  Any Other Special Instructions Will Be Listed Below (If Applicable).  If you need a refill on your cardiac medications before your next appointment, please call your pharmacy.    Signed, Tereso Newcomer, PA-C  02/04/2016 11:23 AM    [facility] [address] [place], [place], [place]  [address] Phone: [phone]; Fax: [phone]

## 2016-02-04 ENCOUNTER — Ambulatory Visit (INDEPENDENT_AMBULATORY_CARE_PROVIDER_SITE_OTHER): Payer: Self-pay | Admitting: Physician Assistant

## 2016-02-04 ENCOUNTER — Encounter: Payer: Self-pay | Admitting: Physician Assistant

## 2016-02-04 VITALS — BP 142/90 | HR 76 | Ht 62.0 in | Wt 290.1 lb

## 2016-02-04 DIAGNOSIS — R079 Chest pain, unspecified: Secondary | ICD-10-CM

## 2016-02-04 DIAGNOSIS — I10 Essential (primary) hypertension: Secondary | ICD-10-CM

## 2016-02-04 DIAGNOSIS — M1990 Unspecified osteoarthritis, unspecified site: Secondary | ICD-10-CM

## 2016-02-04 NOTE — Patient Instructions (Addendum)
Medication Instructions:  No changes.  See your medication list.  Labwork: None   Testing/Procedures: None   Follow-Up: Dr. Tobias AlexanderKatarina Nelson as needed.   *Please try to establish with [facility] and [facility] as your primary care provider. [phone]) [address] [place], [place], [place] [address]   *Another option for primary care would be the [facility] at [address] [place], [place] [address].  Phone number is [phone].  You can try to contact them if the [facility] cannot help you.  Any Other Special Instructions Will Be Listed Below (If Applicable).  If you need a refill on your cardiac medications before your next appointment, please call your pharmacy.

## 2017-03-08 IMAGING — NM NM MISC PROCEDURE
6 series · 36 of 36 positions shown · non-contrast
Comparison: none

[Series 1: wbr_s-proj_st wbr stress-gsp · 6.40mm/px · 6 of 512 frames shown]
[frame 43/512]
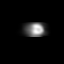
[frame 128/512]
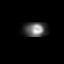
[frame 214/512]
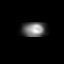
[frame 299/512]
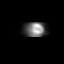
[frame 384/512]
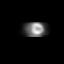
[frame 470/512]
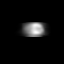

[Series 1: wbr stress-gsp · 6.40mm/px · 6 of 512 frames shown]
[frame 43/512  full-range]
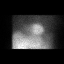
[frame 128/512  full-range]
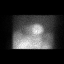
[frame 214/512  full-range]
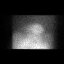
[frame 299/512  full-range]
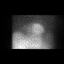
[frame 384/512  full-range]
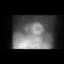
[frame 470/512  full-range]
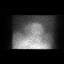

[Series 2: wbr_s-proj_st wbr stress-sum-em · 6.40mm/px · 6 of 64 frames shown]
[frame 6/64]
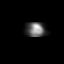
[frame 16/64]
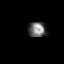
[frame 27/64]
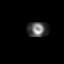
[frame 38/64]
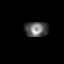
[frame 48/64]
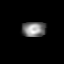
[frame 59/64]
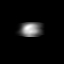

[Series 2: wbr stress-sum-em · 6.40mm/px · 6 of 64 frames shown]
[frame 6/64]
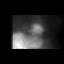
[frame 16/64]
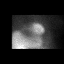
[frame 27/64]
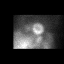
[frame 38/64]
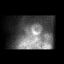
[frame 48/64]
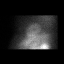
[frame 59/64]
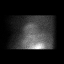

[Series 3: wbr rest · 6.40mm/px · 6 of 64 frames shown]
[frame 6/64]
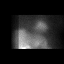
[frame 16/64]
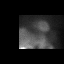
[frame 27/64]
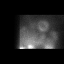
[frame 38/64]
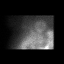
[frame 48/64]
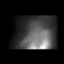
[frame 59/64]
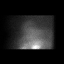

[Series 3: wbr_r-proj_st wbr rest · 6.40mm/px · 6 of 64 frames shown]
[frame 6/64]
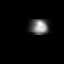
[frame 16/64]
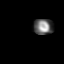
[frame 27/64]
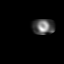
[frame 38/64]
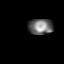
[frame 48/64]
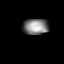
[frame 59/64]
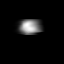

[36 of 36 positions shown; findings below may reference images not displayed]

Canned report from images found in remote index.

Refer to host system for actual result text.

## 2020-01-05 ENCOUNTER — Encounter (HOSPITAL_BASED_OUTPATIENT_CLINIC_OR_DEPARTMENT_OTHER): Payer: Self-pay

## 2020-01-05 ENCOUNTER — Emergency Department (HOSPITAL_BASED_OUTPATIENT_CLINIC_OR_DEPARTMENT_OTHER): Payer: Self-pay

## 2020-01-05 ENCOUNTER — Emergency Department (HOSPITAL_BASED_OUTPATIENT_CLINIC_OR_DEPARTMENT_OTHER)
Admission: EM | Admit: 2020-01-05 | Discharge: 2020-01-05 | Disposition: A | Discharge status: Home / Self Care | Payer: Self-pay | Attending: Emergency Medicine | Admitting: Emergency Medicine

## 2020-01-05 ENCOUNTER — Other Ambulatory Visit: Payer: Self-pay

## 2020-01-05 DIAGNOSIS — R2242 Localized swelling, mass and lump, left lower limb: Secondary | ICD-10-CM | POA: Insufficient documentation

## 2020-01-05 DIAGNOSIS — I10 Essential (primary) hypertension: Secondary | ICD-10-CM | POA: Insufficient documentation

## 2020-01-05 DIAGNOSIS — Z79899 Other long term (current) drug therapy: Secondary | ICD-10-CM | POA: Insufficient documentation

## 2020-01-05 DIAGNOSIS — M25562 Pain in left knee: Secondary | ICD-10-CM | POA: Insufficient documentation

## 2020-01-05 DIAGNOSIS — M79605 Pain in left leg: Secondary | ICD-10-CM | POA: Insufficient documentation

## 2020-01-05 MED ORDER — KETOROLAC TROMETHAMINE 15 MG/ML IJ SOLN
15.0000 mg | Freq: Once | INTRAMUSCULAR | Status: AC
  Administered 2020-01-05: 15 mg via INTRAMUSCULAR
  Filled 2020-01-05: qty 1

## 2020-01-05 MED ORDER — DICLOFENAC SODIUM 50 MG PO TBEC: 50.0000 mg | DELAYED_RELEASE_TABLET | Freq: Two times a day (BID) | ORAL | 0 refills | Status: AC

## 2020-01-05 NOTE — Discharge Instructions (Addendum)
You have been seen today for knee pain. Please read and follow all provided instructions. Return to the emergency room for worsening condition or new concerning symptoms.    Your [place] did not show a blood clot. Your xray shows you have arthritis in your knee. Your pain could be caused by a pinched nerve, sciatica.  I have included information about this for you to do some reading to learn more information.  1. Medications:  -Prescription sent to your pharmacy for diclofenac.  This is a pill to help with arthritis pain.  It is similar to an anti-inflammatory so you should not take any additional anti-inflammatories while on this medicine. Continue other usual home medications Take medications as prescribed. Please review all of the medicines and only take them if you do not have an allergy to them.   2. Treatment: rest, drink plenty of fluids  3. Follow Up:  Please follow up with primary care provider by scheduling an appointment as soon as possible for a visit  If you do not have a primary care physician, contact [facility] at [phone] for referral -You can also follow-up with Dr. Jordan Likes.  He is a sports medicine doctor located upstairs in this building.  You can call his office to schedule the next available follow-up appointment.   It is also a possibility that you have an allergic reaction to any of the medicines that you have been prescribed - Everybody reacts differently to medications and while MOST people have no trouble with most medicines, you may have a reaction such as nausea, vomiting, rash, swelling, shortness of breath. If this is the case, please stop taking the medicine immediately and contact your physician.  ?

## 2020-01-05 NOTE — ED Provider Notes (Signed)
[facility] EMERGENCY DEPARTMENT Provider Note   CSN: [id number] Arrival date & time: 01/05/20  1143     History Chief Complaint  Patient presents with  . Leg Swelling    Carrie Rush is a 59 y.o. female with past medical history significant for hypertension, arthritis presents to emergency department today with chief complaint of progressively worsening left leg swelling and painx 1 week.  She is reporting pain radiating down from her left hip to her ankle worse with ambulation.  She also has pain located behind her left knee.  She states she felt she is a fullness behind her left knee.  She has swelling to her left knee and calf as well.  She has tried her home medications without any symptom improvement.  She denies any injury, fall or trauma.  Patient is on an anti-inflammatory prescribed by her PCP in [place], she is unsure exactly of the name of the medication.  She rates the pain 7 out of 10 in severity.  She denies history of similar pain.  She denies fever, chills, weakness, numbness, tingling, decrease sensation.  She also denies any recent long periods immobilization, personal or family history of blood clots.   Past Medical History:  Diagnosis Date  . Arthritis   . Chest pain at rest    Admx in 6/17 >> neg CEs, normal Echo, normal OP stress nuc study  . History of echocardiogram    a. Echo 01/15/16: mild conc LVH, EF 60-65%, no RWMA, Gr 1 DD.   Marland Kitchen History of nuclear stress test    a. Myoview 01/31/16:  EF 69%, normal perfusion, low risk  . Hypertension     Patient Active Problem List   Diagnosis Date Noted  . Hypertension 01/14/2016  . Chest pain 01/14/2016    Past Surgical History:  Procedure Laterality Date  . ANKLE SURGERY Left 2012     OB History   No obstetric history on file.     Family History  Problem Relation Age of Onset  . Alzheimer's disease Mother   . CAD Brother   . Lymphoma Other   . Diabetes Neg Hx   . Stroke Neg Hx     Social  History   Tobacco Use  . Smoking status: Never Smoker  . Smokeless tobacco: Never Used  Substance Use Topics  . Alcohol use: No  . Drug use: No    Home Medications Prior to Admission medications   Medication Sig Start Date End Date Taking? Authorizing Provider  folic acid (FOLVITE) 1 MG tablet Take 10 mg by mouth daily.    [provider]  Omega-3 Fatty Acids (FISH OIL PO) Take 1 tablet by mouth daily.    [provider]  PRESCRIPTION MEDICATION Take 1-2 tablets by mouth daily. salazopyrin    [provider]  triamterene-hydrochlorothiazide (DYAZIDE) 37.5-25 MG capsule Take 1 capsule by mouth daily.    [provider]    Allergies    Other  Review of Systems   Review of Systems All other systems are reviewed and are negative for acute change except as noted in the HPI.  Physical Exam Updated Vital Signs BP (!) 165/92 (BP Location: Left Arm)   Pulse 79   Resp 16   Ht 5\' 2"  (1.575 m)   Wt 128.5 kg   SpO2 97%   BMI 51.80 kg/m   Physical Exam Vitals and nursing note reviewed.  Constitutional:      Appearance: She is well-developed.  She is not ill-appearing or toxic-appearing.  HENT:     Head: Normocephalic and atraumatic.     Nose: Nose normal.  Eyes:     General: No scleral icterus.       Right eye: No discharge.        Left eye: No discharge.     Conjunctiva/sclera: Conjunctivae normal.  Neck:     Vascular: No JVD.  Cardiovascular:     Rate and Rhythm: Normal rate and regular rhythm.     Pulses: Normal pulses.          Dorsalis pedis pulses are 2+ on the right side and 2+ on the left side.     Heart sounds: Normal heart sounds.  Pulmonary:     Effort: Pulmonary effort is normal.     Breath sounds: Normal breath sounds.  Abdominal:     General: There is no distension.  Musculoskeletal:        General: Normal range of motion.     Cervical back: Normal range of motion.     Right lower leg: No edema.     Left lower leg: No  swelling, lacerations, tenderness or bony tenderness. No edema.     Left ankle: Normal.     Left Achilles Tendon: Normal.     Left foot: Normal.     Comments: No cervical, thoracic, or lumbar spinal tenderness to palpation. No paraspinal tenderness. No step offs, crepitus or deformity palpated.  Negative straight leg raise test bilaterally.   Skin:    General: Skin is warm and dry.  Neurological:     Mental Status: She is oriented to person, place, and time.     GCS: GCS eye subscore is 4. GCS verbal subscore is 5. GCS motor subscore is 6.     Comments: Fluent speech, no facial droop.  Psychiatric:        Behavior: Behavior normal.     ED Results / Procedures / Treatments   Labs (all labs ordered are listed, but only abnormal results are displayed) Labs Reviewed - No data to display  EKG None  Radiology US Venous Img Lower Unilateral Left  Result Date: 01/05/2020 CLINICAL DATA:  Lower extremity pain EXAM: RIGHT LOWER EXTREMITY VENOUS DUPLEX ULTRASOUND TECHNIQUE: Gray-scale sonography with graded compression, as well as color Doppler and duplex ultrasound were performed to evaluate the right lower extremity deep venous system from the level of the common femoral vein and including the common femoral, femoral, profunda femoral, popliteal and calf veins including the posterior tibial, peroneal and gastrocnemius veins when visible. The superficial great saphenous vein was also interrogated. Spectral Doppler was utilized to evaluate flow at rest and with distal augmentation maneuvers in the common femoral, femoral and popliteal veins. COMPARISON:  None. FINDINGS: Contralateral Common Femoral Vein: Respiratory phasicity is normal and symmetric with the symptomatic side. No evidence of thrombus. Normal compressibility. Common Femoral Vein: No evidence of thrombus. Normal compressibility, respiratory phasicity and response to augmentation. Saphenofemoral Junction: No evidence of thrombus. Normal  compressibility and flow on color Doppler imaging. Profunda Femoral Vein: No evidence of thrombus. Normal compressibility and flow on color Doppler imaging. Femoral Vein: No evidence of thrombus. Normal compressibility, respiratory phasicity and response to augmentation. Popliteal Vein: No evidence of thrombus. Normal compressibility, respiratory phasicity and response to augmentation. Calf Veins: No evidence of thrombus. Normal compressibility and flow on color Doppler imaging. Superficial Great Saphenous Vein: No evidence of thrombus. Normal compressibility. Venous Reflux:  None. Other Findings: Benign-appearing  probable reactive lymph nodes noted in each inguinal region. IMPRESSION: No evidence of deep venous thrombosis in the left lower extremity. Right common femoral vein also patent. Electronically Signed   By: Lowella Grip III M.D.   On: 01/05/2020 15:19   DG Knee Complete 4 Views Left  Result Date: 01/05/2020 CLINICAL DATA:  Left posterior knee pain and swelling for the past week. No injury. EXAM: LEFT KNEE - COMPLETE 4+ VIEW COMPARISON:  None. FINDINGS: No acute fracture or dislocation. No joint effusion. Tricompartmental degenerative changes, moderate to severe in the medial compartment. Bone mineralization is normal. Soft tissues are unremarkable. IMPRESSION: 1.  No acute osseous abnormality. 2. Tricompartmental osteoarthritis. Electronically Signed   By: Titus Dubin M.D.   On: 01/05/2020 15:11    Procedures Procedures (including critical care time)  Medications Ordered in ED Medications - No data to display  ED Course  I have reviewed the triage vital signs and the nursing notes.  Pertinent labs & imaging results that were available during my care of the patient were reviewed by me and considered in my medical decision making (see chart for details).    MDM Rules/Calculators/A&P                      History provided by patient with additional history obtained from chart  review.    Patient seen and examined. Patient presents awake, alert, hemodynamically stable, afebrile, non toxic. No tachycardia or hypoxia. On exam she has no obvious lower extremity edema.  She has DP pulse 2+ bilaterally. Compartments are soft. She has mildly antalgic gait. No back pain, no midline spinous tenderness of cervical, thoracic, lumbar spine.  [place] is negative for DVT. Left knee xray viewed by me shows tricompartmental osteoarthritis, no fracture or dislocation. I viewed patient;s labs in the past and she does not have history of elevated creatinine. Patient given low dose of IM Toradol with symptom improvement.  Will discharge with prescription for diclofenac and advised not to take any additional anti-inflammatories at the same time.  The patient appears reasonably screened and/or stabilized for discharge and I doubt any other medical condition or other [facility] requiring further screening, evaluation, or treatment in the ED at this time prior to discharge. The patient is safe for discharge with strict return precautions discussed. Recommend sports medicine follow up if symptoms persist.  Portions of this note were generated with [employer] dictation software. Dictation errors may occur despite best attempts at proofreading.    Final Clinical Impression(s) / ED Diagnoses Final diagnoses:  None    Rx / DC Orders ED Discharge Orders    None       Flint Melter 01/05/20 1550    Tegeler, Gwenyth Allegra, MD 01/05/20 1940

## 2020-01-05 NOTE — ED Triage Notes (Signed)
Pt c/o pain/swelling to left LE/knee-NAD-steady gait
